# Patient Record
Sex: Male | Born: 1950 | Race: White | Hispanic: No | Marital: Married | Smoking: Never smoker
Health system: Southern US, Community
[De-identification: ages and names within clinical notes are randomized; demographics above are authoritative.]

---

## 2016-03-21 DIAGNOSIS — I10 Essential (primary) hypertension: Secondary | ICD-10-CM | POA: Insufficient documentation

## 2016-03-21 DIAGNOSIS — H8109 Meniere's disease, unspecified ear: Secondary | ICD-10-CM | POA: Insufficient documentation

## 2016-03-21 DIAGNOSIS — E785 Hyperlipidemia, unspecified: Secondary | ICD-10-CM | POA: Insufficient documentation

## 2021-02-08 ENCOUNTER — Encounter: Payer: Self-pay | Admitting: Family Medicine

## 2021-02-08 ENCOUNTER — Other Ambulatory Visit: Payer: Self-pay

## 2021-02-08 ENCOUNTER — Ambulatory Visit (INDEPENDENT_AMBULATORY_CARE_PROVIDER_SITE_OTHER): Payer: Medicare Other | Admitting: Family Medicine

## 2021-02-08 VITALS — BP 130/69 | HR 73 | Ht 69.75 in | Wt 180.4 lb

## 2021-02-08 DIAGNOSIS — R21 Rash and other nonspecific skin eruption: Secondary | ICD-10-CM

## 2021-02-08 DIAGNOSIS — R0609 Other forms of dyspnea: Secondary | ICD-10-CM

## 2021-02-08 DIAGNOSIS — E785 Hyperlipidemia, unspecified: Secondary | ICD-10-CM | POA: Diagnosis not present

## 2021-02-08 DIAGNOSIS — R0600 Dyspnea, unspecified: Secondary | ICD-10-CM | POA: Diagnosis not present

## 2021-02-08 DIAGNOSIS — N529 Male erectile dysfunction, unspecified: Secondary | ICD-10-CM | POA: Diagnosis not present

## 2021-02-08 DIAGNOSIS — I10 Essential (primary) hypertension: Secondary | ICD-10-CM | POA: Diagnosis not present

## 2021-02-08 DIAGNOSIS — Z0000 Encounter for general adult medical examination without abnormal findings: Secondary | ICD-10-CM | POA: Diagnosis not present

## 2021-02-08 DIAGNOSIS — N401 Enlarged prostate with lower urinary tract symptoms: Secondary | ICD-10-CM

## 2021-02-08 DIAGNOSIS — R3911 Hesitancy of micturition: Secondary | ICD-10-CM

## 2021-02-08 NOTE — Progress Notes (Signed)
Office Visit Note   Patient: Bryan Graham           Date of Birth: 27-Jun-1951           MRN: [id number] Visit Date: 02/08/2021 Requested by: No referring provider defined for this encounter. PCP: No primary care provider on file.  Subjective: Chief Complaint  Patient presents with  . Other    Establish primary care    HPI: He is here for a wellness exam.  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  He really enjoys his new job.  He has a history of a brief episode of atrial fibrillation years ago.  He has had occasional heartbeat irregularities lasting only for a few minutes, recently it has been happening probably once per week.  He does not have any chest pain, shortness of breath during these episodes.  He does note that he has had dyspnea on exertion for the past 20 years.  He can no longer run or exercise vigorously because of this.  Again there is no pain in the chest during this, and no musculoskeletal pains.  He has hyperlipidemia and within the past year he started taking a statin.  He has not noticed any side effects.  He has a rash on both of his hands for the past 6 or 7 months.  It is very itchy at times.  He has tried various topical steroids and has seen a dermatologist.  He is getting the most relief from Melaleuca.  No other areas of skin involvement.  He had an episode of Mnire's disease many years ago.  He was placed on hydrochlorothiazide and has been on that since then.  He has had 1 or 2 minor episodes since then but not any in the past 10 years or so.  He gets occasional BPH symptoms with urinary hesitancy.  He has trouble with ED for the past few years.  He is up-to-date on eye exams and dental exams.  He has had colonoscopy.               ROS:   All other systems were reviewed and are negative.  Objective: Vital Signs: BP 130/69   Pulse 73   Ht 5' 9.75" (1.772 m)   Wt 180 lb 6.4 oz (81.8 kg)   BMI 26.07 kg/m   Physical Exam:   General:  Alert and oriented, in no acute distress. Pulm:  Breathing unlabored. Psy:  Normal mood, congruent affect. Skin: He has what looks like eczema on the dorsum of his hands especially near the MCP joints. HEENT:   Tympanic membranes are pearly gray with normal landmarks.  External ear canals are normal.  Nasal passages are clear.  Oropharynx is clear.  No significant lymphadenopathy.  No thyromegaly or nodules.  2+ carotid pulses without bruits. CV: Regular rate and rhythm without murmurs, rubs, or gallops.  No peripheral edema.  2+ radial and posterior tibial pulses. Lungs: Clear to auscultation throughout with no wheezing or areas of consolidation. Abd: Bowel sounds are active, no hepatosplenomegaly or masses.  Soft and nontender.  No audible bruits.  No evidence of ascites. Extremities: 2+ upper and lower DTRs.  No nail deformities.   Imaging: No results found.  Assessment & Plan: 1.   Wellness examination -Labs drawn today.  Follow-up yearly.  2.  Dyspnea on exertion, etiology uncertain. -Cardiology referral for possible stress testing.  3.  Hypertension, well controlled.  4.  Hyperlipidemia -Due to the  timing of starting it, I question whether his statin might be contributing to his hand rash.  I suggested stopping it for a few weeks to see if it makes any difference.  5.  ED and BPH -Check testosterone levels.  Check PSA.  6.  Rash on hands -If stopping statin does not help, then he will try an elimination diet.      Procedures: No procedures performed        PMFS History: Patient Active Problem List   Diagnosis Date Noted  . Benign hypertension 03/21/2016  . Hyperlipidemia 03/21/2016  . Mnire's disease 03/21/2016   History reviewed. No pertinent past medical history.  Family History  Problem Relation Age of Onset  . Alzheimer's disease Mother   . Arthritis Mother   . Arthritis Sister   . Healthy Brother   . Uterine cancer Maternal Grandmother    . Cancer Maternal Grandmother   . Prostate cancer Other   . Cancer Other   . Diabetes Neg Hx   . Colon cancer Neg Hx     History reviewed. No pertinent surgical history. Social History   Occupational History  . Not on file  Tobacco Use  . Smoking status: Not on file  . Smokeless tobacco: Not on file  Substance and Sexual Activity  . Alcohol use: Not on file  . Drug use: Not on file  . Sexual activity: Not on file

## 2021-02-11 ENCOUNTER — Telehealth: Payer: Self-pay | Admitting: Family Medicine

## 2021-02-11 LAB — CBC WITH DIFFERENTIAL/PLATELET
Absolute Monocytes: 821 cells/uL (ref 200–950)
Basophils Absolute: 23 cells/uL (ref 0–200)
Basophils Relative: 0.3 %
Eosinophils Absolute: 152 cells/uL (ref 15–500)
Eosinophils Relative: 2 %
HCT: 46.3 % (ref 38.5–50.0)
Hemoglobin: 16 g/dL (ref 13.2–17.1)
Lymphs Abs: 1140 cells/uL (ref 850–3900)
MCH: 32 pg (ref 27.0–33.0)
MCHC: 34.6 g/dL (ref 32.0–36.0)
MCV: 92.6 fL (ref 80.0–100.0)
MPV: 10.7 fL (ref 7.5–12.5)
Monocytes Relative: 10.8 %
Neutro Abs: 5464 cells/uL (ref 1500–7800)
Neutrophils Relative %: 71.9 %
Platelets: 191 10*3/uL (ref 140–400)
RBC: 5 10*6/uL (ref 4.20–5.80)
RDW: 12 % (ref 11.0–15.0)
Total Lymphocyte: 15 %
WBC: 7.6 10*3/uL (ref 3.8–10.8)

## 2021-02-11 LAB — COMPREHENSIVE METABOLIC PANEL
AG Ratio: 2 (calc) (ref 1.0–2.5)
ALT: 17 U/L (ref 9–46)
AST: 20 U/L (ref 10–35)
Albumin: 4.3 g/dL (ref 3.6–5.1)
Alkaline phosphatase (APISO): 73 U/L (ref 35–144)
BUN: 12 mg/dL (ref 7–25)
CO2: 27 mmol/L (ref 20–32)
Calcium: 9.4 mg/dL (ref 8.6–10.3)
Chloride: 99 mmol/L (ref 98–110)
Creat: 0.77 mg/dL (ref 0.70–1.25)
Globulin: 2.2 g/dL (calc) (ref 1.9–3.7)
Glucose, Bld: 105 mg/dL — ABNORMAL HIGH (ref 65–99)
Potassium: 3.8 mmol/L (ref 3.5–5.3)
Sodium: 136 mmol/L (ref 135–146)
Total Bilirubin: 1.3 mg/dL — ABNORMAL HIGH (ref 0.2–1.2)
Total Protein: 6.5 g/dL (ref 6.1–8.1)

## 2021-02-11 LAB — TESTOSTERONE TOTAL,FREE,BIO, MALES
Albumin: 4.3 g/dL (ref 3.6–5.1)
Sex Hormone Binding: 53 nmol/L (ref 22–77)
Testosterone, Bioavailable: 107.1 ng/dL — ABNORMAL LOW (ref >=110.0)
Testosterone, Free: 54.4 pg/mL (ref 46.0–224.0)
Testosterone: 592 ng/dL (ref 250–827)

## 2021-02-11 LAB — THYROID PANEL WITH TSH
Free Thyroxine Index: 2.4 (ref 1.4–3.8)
T3 Uptake: 31 % (ref 22–35)
T4, Total: 7.8 ug/dL (ref 4.9–10.5)
TSH: 1.77 mIU/L (ref 0.40–4.50)

## 2021-02-11 LAB — LIPID PANEL
Cholesterol: 126 mg/dL (ref <200)
HDL: 54 mg/dL (ref >=40)
LDL Cholesterol (Calc): 56 mg/dL (calc)
Non-HDL Cholesterol (Calc): 72 mg/dL (calc) (ref <130)
Total CHOL/HDL Ratio: 2.3 (calc) (ref <5.0)
Triglycerides: 78 mg/dL (ref <150)

## 2021-02-11 LAB — ANA: Anti Nuclear Antibody (ANA): NEGATIVE

## 2021-02-11 LAB — HIGH SENSITIVITY CRP: hs-CRP: 1.9 mg/L

## 2021-02-11 LAB — PSA: PSA: 0.98 ng/mL (ref <=4.0)

## 2021-02-11 NOTE — Telephone Encounter (Signed)
Thyroid studies look normal.    Testosterone numbers are good.  Glucose is in prediabetes range.  Important to limit intake of processed carbs and sweets, and to maintain regular exercise.  Lipids are getting pretty low.  When total cholesterol drops too low, it can lead to depression and other symptoms.  I think you should consider a lower dose, such as 5 mg every other day.  Recheck lipids in about 6 months.

## 2021-02-22 ENCOUNTER — Ambulatory Visit (INDEPENDENT_AMBULATORY_CARE_PROVIDER_SITE_OTHER): Payer: Medicare Other | Admitting: Cardiovascular Disease

## 2021-02-22 ENCOUNTER — Encounter: Payer: Self-pay | Admitting: Cardiovascular Disease

## 2021-02-22 ENCOUNTER — Other Ambulatory Visit: Payer: Self-pay

## 2021-02-22 DIAGNOSIS — I119 Hypertensive heart disease without heart failure: Secondary | ICD-10-CM

## 2021-02-22 DIAGNOSIS — E782 Mixed hyperlipidemia: Secondary | ICD-10-CM

## 2021-02-22 DIAGNOSIS — I10 Essential (primary) hypertension: Secondary | ICD-10-CM

## 2021-02-22 NOTE — Assessment & Plan Note (Signed)
History of hyperlipidemia with lipid profile performed 02/08/2021 while on Crestor revealing 2000 126, LDL 56 and HDL of 54.  He has since stopped the Crestor to see whether or not a rash on his hand was related to this although this has not changed.  I will leave it up to Dr. Prince Rome, his PCP, to determine whether to reinitiate statin therapy and at what dose.

## 2021-02-22 NOTE — Patient Instructions (Signed)
Medication Instructions:  Your physician recommends that you continue on your current medications as directed. Please refer to the Current Medication list given to you today.  *If you need a refill on your cardiac medications before your next appointment, please call your pharmacy*   Testing/Procedures: Your physician has requested that you have an echocardiogram. Echocardiography is a painless test that uses sound waves to create images of your heart. It provides your doctor with information about the size and shape of your heart and how well your heart's chambers and valves are working. This procedure takes approximately one hour. There are no restrictions for this procedure. This test is done at [address]  Dr. Allyson Sabal has ordered a CT coronary calcium score. This test is done at [address]. This is $99 out of pocket.   Coronary CalciumScan A coronary calcium scan is an imaging test used to look for deposits of calcium and other fatty materials (plaques) in the inner lining of the blood vessels of the heart (coronary arteries). These deposits of calcium and plaques can partly clog and narrow the coronary arteries without producing any symptoms or warning signs. This puts a person at risk for a heart attack. This test can detect these deposits before symptoms develop. Tell a health care provider about:  Any allergies you have.  All medicines you are taking, including vitamins, herbs, eye drops, creams, and over-the-counter medicines.  Any problems you or family members have had with anesthetic medicines.  Any blood disorders you have.  Any surgeries you have had.  Any medical conditions you have.  Whether you are pregnant or may be pregnant. What are the risks? Generally, this is a safe procedure. However, problems may occur, including:  Harm to a pregnant woman and her unborn baby. This test involves the use of radiation. Radiation exposure  can be dangerous to a pregnant woman and her unborn baby. If you are pregnant, you generally should not have this procedure done.  Slight increase in the risk of cancer. This is because of the radiation involved in the test. What happens before the procedure? No preparation is needed for this procedure. What happens during the procedure?  You will undress and remove any jewelry around your neck or chest.  You will put on a hospital gown.  Sticky electrodes will be placed on your chest. The electrodes will be connected to an electrocardiogram (ECG) machine to record a tracing of the electrical activity of your heart.  A CT scanner will take pictures of your heart. During this time, you will be asked to lie still and hold your breath for 2-3 seconds while a picture of your heart is being taken. The procedure may vary among health care providers and hospitals. What happens after the procedure?  You can get dressed.  You can return to your normal activities.  It is up to you to get the results of your test. Ask your health care provider, or the department that is doing the test, when your results will be ready. Summary  A coronary calcium scan is an imaging test used to look for deposits of calcium and other fatty materials (plaques) in the inner lining of the blood vessels of the heart (coronary arteries).  Generally, this is a safe procedure. Tell your health care provider if you are pregnant or may be pregnant.  No preparation is needed for this procedure.  A CT scanner will take pictures of your  heart.  You can return to your normal activities after the scan is done. This information is not intended to replace advice given to you by your health care provider. Make sure you discuss any questions you have with your health care provider. Document Released: 06/12/2008 Document Revised: 11/03/2016 Document Reviewed: 11/03/2016 Elsevier Interactive Patient Education  2017 [employer].    Follow-Up: At [facility], you and your health needs are our priority.  As part of our continuing mission to provide you with exceptional heart care, we have created designated Provider Care Teams.  These Care Teams include your primary Cardiologist (physician) and Advanced Practice Providers (APPs -  Physician Assistants and Nurse Practitioners) who all work together to provide you with the care you need, when you need it.  We recommend signing up for the patient portal called "MyChart".  Sign up information is provided on this After Visit Summary.  MyChart is used to connect with patients for Virtual Visits (Telemedicine).  Patients are able to view lab/test results, encounter notes, upcoming appointments, etc.  Non-urgent messages can be sent to your provider as well.   To learn more about what you can do with MyChart, go to [email or web address].    Your next appointment:   No future appointments have been made. If you need an appointment please give our office a call.   Provider:   Nanetta Batty, MD

## 2021-02-22 NOTE — Assessment & Plan Note (Signed)
Of essential potential blood pressure measured today 128/72.  He is on Maxide which she takes in addition for his Mnire's disease.

## 2021-02-22 NOTE — Progress Notes (Signed)
02/22/2021 Bryan Graham   May 10, 1951  [id number]  Primary Physician Pcp, No Primary Cardiologist: Bryan Gess MD Bryan Graham  HPI:  Bryan Graham is a 70 y.o. thin and fit appearing married Caucasian male with no children referred by Dr. Prince Graham, his PCP, for cardiovascular valuation because of mild dyspnea and fatigue.  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  His risk factors include treated hyperlipidemia and essential hypertension.  Is no family history for heart disease.  He denies chest pain or shortness of breath.  He is fairly active although less so than he was in his younger years which is bothersome to him.  He also complains of some potency issues.   Current Meds  Medication Sig  . B Complex Vitamins (VITAMIN-B COMPLEX PO) Take by mouth in the morning and at bedtime.  . Chelated Zinc 23 MG LOZG   . Cholecalciferol (VITAMIN D3) 50 MCG (2000 UT) TABS Take by mouth 2 (two) times daily.  . NON FORMULARY daily.  . Omega-3 Fatty Acids (FISH OIL PO) Take by mouth 2 (two) times daily.  Marland Kitchen QUERCETIN PO Take 500 mg by mouth in the morning and at bedtime.  . triamterene-hydrochlorothiazide (MAXZIDE-25) 37.5-25 MG tablet Take 1 tablet by mouth daily.  . vitamin C (ASCORBIC ACID) 500 MG tablet Take 500 mg by mouth 2 (two) times daily. Unsure of mg  . [DISCONTINUED] Quercetin 250 MG TABS Take 500 mg by mouth 2 (two) times daily.  . [DISCONTINUED] Zinc 50 MG TABS Take by mouth in the morning and at bedtime.     No Known Allergies  Social History   Socioeconomic History  . Marital status: Married    Spouse name: Not on file  . Number of children: Not on file  . Years of education: Not on file  . Highest education level: Not on file  Occupational History  . Not on file  Tobacco Use  . Smoking status: Not on file  . Smokeless  tobacco: Not on file  Substance and Sexual Activity  . Alcohol use: Not on file  . Drug use: Not on file  . Sexual activity: Not on file  Other Topics Concern  . Not on file  Social History Narrative  . Not on file   Social Determinants of Health   Financial Resource Strain: Not on file  Food Insecurity: Not on file  Transportation Needs: Not on file  Physical Activity: Not on file  Stress: Not on file  Social Connections: Not on file  Intimate Partner Violence: Not on file     Review of Systems: General: negative for chills, fever, night sweats or weight changes.  Cardiovascular: negative for chest pain, dyspnea on exertion, edema, orthopnea, palpitations, paroxysmal nocturnal dyspnea or shortness of breath Dermatological: negative for rash Respiratory: negative for cough or wheezing Urologic: negative for hematuria Abdominal: negative for nausea, vomiting, diarrhea, bright red blood per rectum, melena, or hematemesis Neurologic: negative for visual changes, syncope, or dizziness All other systems reviewed and are otherwise negative except as noted above.    Blood pressure 128/72, pulse 65, height 5\' 11"  (1.803 m), weight 188 lb 6.4 oz (85.5 kg), SpO2 98 %.  General appearance: alert and no distress Neck: no adenopathy, no carotid bruit, no JVD, supple, symmetrical, trachea midline and thyroid not enlarged, symmetric, no tenderness/mass/nodules  Lungs: clear to auscultation bilaterally Heart: regular rate and rhythm, S1, S2 normal, no murmur, click, rub or gallop Extremities: extremities normal, atraumatic, no cyanosis or edema Pulses: 2+ and symmetric Skin: Skin color, texture, turgor normal. No rashes or lesions Neurologic: Alert and oriented X 3, normal strength and tone. Normal symmetric reflexes. Normal coordination and gait  EKG sinus rhythm at 65 with RSR prime in lead V1 consistent with an RV conduction delay and/or incomplete right bundle branch block.  I  personally reviewed this EKG.  ASSESSMENT AND PLAN:   Hyperlipidemia History of hyperlipidemia with lipid profile performed 02/08/2021 while on Crestor revealing 2000 126, LDL 56 and HDL of 54.  He has since stopped the Crestor to see whether or not a rash on his hand was related to this although this has not changed.  I will leave it up to Dr. Prince Graham, his PCP, to determine whether to reinitiate statin therapy and at what dose.  Benign hypertension Of essential potential blood pressure measured today 128/72.  He is on Maxide which she takes in addition for his Mnire's disease.      Bryan Gess MD FACP,FACC,FAHA, [facility] 02/22/2021 9:47 AM

## 2021-03-16 ENCOUNTER — Encounter: Payer: Self-pay | Admitting: Family Medicine

## 2021-03-18 MED ORDER — TRIAMTERENE-HCTZ 37.5-25 MG PO TABS: 1.0000 | ORAL_TABLET | Freq: Every day | ORAL | 3 refills | Status: DC

## 2021-03-21 ENCOUNTER — Ambulatory Visit (HOSPITAL_COMMUNITY): Payer: Medicare Other | Attending: Cardiovascular Disease

## 2021-03-21 ENCOUNTER — Ambulatory Visit (INDEPENDENT_AMBULATORY_CARE_PROVIDER_SITE_OTHER)
Admission: RE | Admit: 2021-03-21 | Discharge: 2021-03-21 | Disposition: A | Discharge status: Home / Self Care | Payer: Self-pay | Source: Ambulatory Visit | Attending: Cardiovascular Disease | Admitting: Cardiovascular Disease

## 2021-03-21 ENCOUNTER — Other Ambulatory Visit: Payer: Self-pay

## 2021-03-21 DIAGNOSIS — E782 Mixed hyperlipidemia: Secondary | ICD-10-CM

## 2021-03-21 DIAGNOSIS — I10 Essential (primary) hypertension: Secondary | ICD-10-CM

## 2021-03-21 DIAGNOSIS — I119 Hypertensive heart disease without heart failure: Secondary | ICD-10-CM

## 2021-03-21 LAB — ECHOCARDIOGRAM COMPLETE
Area-P 1/2: 2.84 cm2
S' Lateral: 2 cm

## 2021-05-04 ENCOUNTER — Encounter: Payer: Self-pay | Admitting: Family Medicine

## 2021-05-06 MED ORDER — TRIAMTERENE-HCTZ 37.5-25 MG PO TABS: 1.0000 | ORAL_TABLET | Freq: Every day | ORAL | 3 refills | Status: DC

## 2021-05-14 ENCOUNTER — Encounter: Payer: Self-pay | Admitting: Family Medicine

## 2021-05-14 DIAGNOSIS — G479 Sleep disorder, unspecified: Secondary | ICD-10-CM

## 2021-05-14 DIAGNOSIS — R5383 Other fatigue: Secondary | ICD-10-CM

## 2021-06-27 ENCOUNTER — Encounter: Payer: Self-pay | Admitting: Family Medicine

## 2021-06-27 DIAGNOSIS — R21 Rash and other nonspecific skin eruption: Secondary | ICD-10-CM

## 2021-06-27 DIAGNOSIS — M79642 Pain in left hand: Secondary | ICD-10-CM

## 2021-06-27 DIAGNOSIS — M79641 Pain in right hand: Secondary | ICD-10-CM

## 2021-07-09 ENCOUNTER — Telehealth: Payer: Self-pay

## 2021-07-09 ENCOUNTER — Encounter: Payer: Self-pay | Admitting: Family Medicine

## 2021-07-09 DIAGNOSIS — R21 Rash and other nonspecific skin eruption: Secondary | ICD-10-CM

## 2021-07-09 NOTE — Telephone Encounter (Signed)
Patient called he stated he received a mychart message that told him to make a appointment with Dr.Hilts patient stated he is supposed to be referred to another provider he isn't sure why he needs to see Dr.Hilts again he is requesting to be contacted through mychart or by cell:[phone]

## 2021-07-09 NOTE — Telephone Encounter (Signed)
Does the patient need to come back in here? I cannot see anything other than the referrals to specialists.

## 2021-07-09 NOTE — Telephone Encounter (Signed)
Sent the patient a MyChart message - no need to come back in to see Dr. Prince Rome at this point.

## 2021-07-23 ENCOUNTER — Institutional Professional Consult (permissible substitution): Payer: Medicare Other | Admitting: Neurology

## 2021-08-05 MED ORDER — TRIAMTERENE-HCTZ 37.5-25 MG PO TABS: 1.0000 | ORAL_TABLET | Freq: Every day | ORAL | 3 refills | Status: AC

## 2021-08-05 NOTE — Addendum Note (Signed)
Addended by: Lillia Carmel on: 08/05/2021 08:10 AM   Modules accepted: Orders

## 2021-08-13 ENCOUNTER — Encounter: Payer: Self-pay | Admitting: Family Medicine

## 2021-09-03 ENCOUNTER — Encounter: Payer: Self-pay | Admitting: Neurology

## 2021-09-03 ENCOUNTER — Ambulatory Visit (INDEPENDENT_AMBULATORY_CARE_PROVIDER_SITE_OTHER): Payer: Medicare Other | Admitting: Neurology

## 2021-09-03 VITALS — BP 129/76 | HR 66 | Ht 71.0 in | Wt 177.0 lb

## 2021-09-03 DIAGNOSIS — G47429 Narcolepsy in conditions classified elsewhere without cataplexy: Secondary | ICD-10-CM | POA: Diagnosis not present

## 2021-09-03 DIAGNOSIS — G4710 Hypersomnia, unspecified: Secondary | ICD-10-CM | POA: Diagnosis not present

## 2021-09-03 NOTE — Patient Instructions (Signed)
Narcolepsy Narcolepsy is a neurological disorder that causes people to fall asleep suddenly and without control (have sleep attacks) during the daytime. It is a lifelong disorder. Narcolepsy disrupts the sleep cycle at night, which then causes daytime sleepiness. What are the causes? The cause of narcolepsy is not fully understood, but it may be related to: Low levels of hypocretin, a chemical (neurotransmitter) in the brain that controls sleep and wake cycles. Hypocretin imbalance may be caused by: Abnormal genes that are passed from parent to child (inherited). An autoimmune disease in which the body's defense system (immune system) attacks the brain cells that make hypocretin. Infection, tumor, or injury in the area of the brain that controls sleep. Exposure to poisons (toxins), such as heavy metals, pesticides, and secondhand smoke. What are the signs or symptoms? Symptoms of this condition include: Excessive daytime sleepiness. This is the most common symptom and is usually the first symptom you will notice. This may affect your performance at work or school. Sleep attacks. You may fall asleep in the middle of an activity, especially low-energy activities like reading or watching TV. Feeling like you cannot think clearly and trouble focusing or remembering things. You may also feel depressed. Sudden muscle weakness (cataplexy). When this occurs, your speech may become slurred, or your knees may buckle. Cataplexy is usually triggered by surprise, anger, fear, or laughter. Losing the ability to speak or move (sleep paralysis). This may occur just as you start to fall asleep or wake up. You will be aware of the paralysis. It usually lasts for just a few seconds or minutes. Seeing, hearing, tasting, smelling, or feeling things that are not real (hallucinations). Hallucinations may occur with sleep paralysis. They can happen when you are falling asleep, waking up, or dozing. Trouble staying asleep  at night (insomnia) and restless sleep. How is this diagnosed? This condition may be diagnosed based on: A physical exam to rule out any other problems that may be causing your symptoms. You may be asked to write down your sleeping patterns for several weeks in a sleep diary. This will help your health care provider make a diagnosis. Sleep studies that measure how well your REM sleep is regulated. These tests also measure your heart rate, breathing, movement, and brain waves. These tests include: An overnight sleep study (polysomnogram). A daytime sleep study that is done while you take several naps during the day (multiple sleep latency test, MSLT). This test measures how quickly you fall asleep and how quickly you enter REM sleep. Removal of spinal fluid to measure hypocretin levels. How is this treated? There is no cure for this condition, but treatment can help relieve symptoms. Treatment may include: Lifestyle and sleeping strategies to help you cope with the condition, such as: Exercising regularly. Maintaining a regular sleep schedule. Avoiding caffeine and large meals before bed. Medicines. These may include: Medicines that help keep you awake and alert (stimulants) to fight daytime sleepiness. Medicines that treat depression (antidepressants). These may be used to treat cataplexy. Sodium oxybate. This is a strong medicine to help you relax (sedative) that you may take at night. It can help control daytime sleepiness and cataplexy. Other treatments may include mental health counseling or joining a support group. Follow these instructions at home: Sleeping habits  Get about 8 hours of sleep every night. Go to sleep and get up at about the same time every day. Keep your bedroom dark, quiet, and comfortable. When you feel very tired, take short naps. Schedule naps   so that you take them at about the same time every day. Before bedtime: Avoid bright lights and screens. Relax. Try  activities like reading or taking a warm bath. Activity Get at least 20 minutes of exercise every day. This will help you sleep better at night and reduce daytime sleepiness. Avoid exercising within 3 hours of bedtime. Do not drive or use heavy machinery if you are sleepy. If possible, take a nap before driving. Do not swim or go out on the water without a life jacket. Eating and drinking Do not drink alcohol or caffeinated beverages within 4-5 hours of bedtime. Do not eat a large meal before bedtime. Eat meals at about the same times every day. General instructions  Take over-the-counter and prescription medicines only as told by your health care provider. Keep a sleep diary as told by your health care provider. Tell your employer or teachers that you have narcolepsy. You may be able to adjust your schedule to include time for naps. Do not use any products that contain nicotine or tobacco, such as cigarettes, e-cigarettes, and chewing tobacco. If you need help quitting, ask your health care provider. Keep all follow-up visits as told by your health care provider. This is important. Where to find more information [employer] of Neurological Disorders: [employer] Contact a health care provider if: Your symptoms are not getting better. You have increasingly high blood pressure (hypertension). You have changes in your heart rhythm. You are having a hard time determining what is real and what is not (psychosis). Get help right away if you: Hurt yourself during a sleep attack or an attack of cataplexy. Have chest pain. Have trouble breathing. These symptoms may represent a serious problem that is an emergency. Do not wait to see if the symptoms will go away. Get medical help right away. Call your local emergency services (911 in the [place] Do not drive yourself to the hospital. Summary Narcolepsy is a neurological disorder that causes people to fall asleep suddenly, and without  control, during the daytime (sleep attacks). It is a lifelong disorder. There is no cure for this condition, but treatment can help relieve symptoms. Go to sleep and get up at about the same time every day. Follow instructions about sleep and activities as told by your health care provider. Take over-the-counter and prescription medicines only as told by your health care provider. This information is not intended to replace advice given to you by your health care provider. Make sure you discuss any questions you have with your health care provider. Document Revised: 07/27/2019 Document Reviewed: 07/27/2019 Elsevier Patient Education  2022 [employer] Inc. Hypersomnia Hypersomnia is a condition in which a person feels very tired during the day even though he or she gets plenty of sleep at night. A person with this condition may take naps during the day and may find it very difficult to wake up from sleep. Hypersomnia may affect a person's ability to think, concentrate, drive, or remember things. What are the causes? The cause of this condition may not be known. Possible causes include: Certain medicines. Sleep disorders, such as narcolepsy and sleep apnea. Injury to the head, brain, or spinal cord. Drug or alcohol use. Gastroesophageal reflux disease (GERD). Tumors. Certain medical conditions, such as depression, diabetes, or an underactive thyroid gland (hypothyroidism). What are the signs or symptoms? The main symptoms of hypersomnia include: Feeling very tired throughout the day, regardless of how much sleep you got the night before. Having trouble waking up.  Others may find it difficult to wake you up when you are sleeping. Sleeping for longer and longer periods at a time. Taking naps throughout the day. Other symptoms may include: Feeling restless, anxious, or annoyed. Lacking energy. Having trouble with: Remembering. Speaking. Thinking. Loss of appetite. Seeing, hearing, tasting,  smelling, or feeling things that are not real (hallucinations). How is this diagnosed? This condition may be diagnosed based on: Your symptoms and medical history. Your sleeping habits. Your health care provider may ask you to write down your sleeping habits in a daily sleep log, along with any symptoms you have. A series of tests that are done while you sleep (sleep study or polysomnogram). A test that measures how quickly you can fall asleep during the day (daytime nap study or multiple sleep latency test). How is this treated? Treatment can help you manage your condition. Treatment may include: Following a regular sleep routine. Lifestyle changes, such as changing your eating habits, getting regular exercise, and avoiding alcohol or caffeinated beverages. Taking medicines to make you more alert (stimulants) during the day. Treating any underlying medical causes of hypersomnia. Follow these instructions at home: Sleep routine  Schedule the same bedtime and wake-up time each day. Practice a relaxing bedtime routine. This may include reading, meditation, deep breathing, or taking a warm bath before going to sleep. Get regular exercise each day. Avoid strenuous exercise in the evening hours. Keep your sleep environment at a cooler temperature, darkened, and quiet. Sleep with pillows and a mattress that are comfortable and supportive. Schedule short 20-minute naps for when you feel sleepiest during the day. Talk with your employer or teachers about your hypersomnia. If possible, adjust your schedule so that: You have a regular daytime work schedule. You can take a scheduled nap during the day. You do not have to work or be active at night. Do not eat a heavy meal for a few hours before bedtime. Eat your meals at about the same times every day. Avoid drinking alcohol or caffeinated beverages. Safety  Do not drive or use heavy machinery if you are sleepy. Ask your health care provider if it  is safe for you to drive. Wear a life jacket when swimming or spending time near water. General instructions Take supplements and over-the-counter and prescription medicines only as told by your health care provider. Keep a sleep log that will help your doctor manage your condition. This may include information about: What time you go to bed each night. How often you wake up at night. How many hours you sleep at night. How often and for how long you nap during the day. Any observations from others, such as leg movements during sleep, sleep walking, or snoring. Keep all follow-up visits as told by your health care provider. This is important. Contact a health care provider if: You have new symptoms. Your symptoms get worse. Get help right away if: You have serious thoughts about hurting yourself or someone else. If you ever feel like you may hurt yourself or others, or have thoughts about taking your own life, get help right away. You can go to your nearest emergency department or call: Your local emergency services (911 in the [place] A suicide crisis helpline, such as the National Suicide Prevention Lifeline at 775 709 3466. This is open 24 hours a day. Summary Hypersomnia refers to a condition in which you feel very tired during the day even though you get plenty of sleep at night. A person with this condition may  take naps during the day and may find it very difficult to wake up from sleep. Hypersomnia may affect a person's ability to think, concentrate, drive, or remember things. Treatment, such as following a regular sleep routine and making some lifestyle changes, can help you manage your condition. This information is not intended to replace advice given to you by your health care provider. Make sure you discuss any questions you have with your health care provider. Document Revised: 10/25/2020 Document Reviewed: 10/25/2020 Elsevier Patient Education  2022 [employer].

## 2021-09-03 NOTE — Progress Notes (Addendum)
SLEEP MEDICINE CLINIC    Provider:  Larey Seat, MD  Primary Care Physician:  Eunice Blase, MD No address on file     Referring Provider: Dr Gwenlyn Found, MD Cardiology.       Chief Complaint according to patient   Patient presents with:     New Patient (Initial Visit)     I still can fall asleep,but I am mainly fatigued, getting 6 hours of sleep but feel not refreshed.  Covid 19 in July- August 22.      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 Still having lots of dream sleep.  Treated with low dose ritalin for decades and scheduled naps.   Now I am SOB and fatigued, and I can't get exercise. Tested for cardiac factors, testosterone, and all normal. He feels exhausted , had covid recently, making fatigue worse.    I have the pleasure of seeing Bryan Graham today, a right -handed Caucasian male and  has no past medical history on file. NARCOLEPSY, viral labyrinthitis , Tinnitus, hearing loss. MRI negative. Meniere's disease..       Sleep relevant medical history:   narcolepsy- feeling cold at night.   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 The patient currently works in daytime. Pets are not present. Tobacco use: none .  ETOH use - seldomly or none, Caffeine intake in form of Coffee( one cup a  day) Soda( /) Tea ( sometimes , hot) or energy drinks. Regular exercise in form of walking. .   Hobbies :      Sleep habits are as follows: The  patient's dinner time is between 5-7 PM. The patient goes to bed at 11-12 PM and continues to sleep for 6 hours, wakes rarely for any bathroom breaks. He wakes up hourly and doesn't know why.     The preferred sleep position is variable, propping up his elbows. with the support of 1-2 pillows.  Dreams are reportedly frequent/vivid. Snoring only in supine.   7.30 AM is the usual rise time. Leaves home at 8.15 .The patient wakes up with an alarm.  He reports not for long feeling refreshed or restored in AM, with symptoms such as dry mouth, and residual fatigue. Naps are taken frequently, lasting from 15 to 30 minutes and are more refreshing than nocturnal sleep.    Review of Systems: Out of a complete 14 system review, the patient complains of only the following symptoms, and all other reviewed systems are negative.:  Fatigue, sleepiness , snoring, fragmented sleep, vivid dreams. Naps daily.    How likely are you to doze in the following situations: 0 = not likely, 1 = slight chance, 2 = moderate chance, 3 = high chance   Sitting and Reading? Watching Television? Sitting inactive in a public place (theater or meeting)? As a passenger in a car for an hour without a  break? Lying down in the afternoon when circumstances permit? Sitting and talking to someone? Sitting quietly after lunch without alcohol? In a car, while stopped for a few minutes in traffic?   Total = 12/ 24 points   FSS endorsed at 39/ 63 points.   Social History   Socioeconomic History   Marital status: Married    Spouse name: Not on file   Number of children: Not on file   Years of education: Not on file   Highest education level: Not on file  Occupational History   Not on file  Tobacco Use   Smoking status: Never   Smokeless tobacco: Never  Substance and Sexual Activity   Alcohol use: Not on file   Drug use: Never   Sexual activity: Not on file  Other Topics Concern   Not on file  Social History Narrative    Not on file   Social Determinants of Health   Financial Resource Strain: Not on file  Food Insecurity: Not on file  Transportation Needs: Not on file  Physical Activity: Not on file  Stress: Not on file  Social Connections: Not on file    Family History  Problem Relation Age of Onset   Alzheimer's disease Mother    Arthritis Mother    Arthritis Sister    Healthy Brother    Uterine cancer Maternal Grandmother    Cancer Maternal Grandmother    Prostate cancer Other    Cancer Other    Diabetes Neg Hx    Colon cancer Neg Hx     Current Outpatient Medications on File Prior to Visit  Medication Sig Dispense Refill   B Complex Vitamins (VITAMIN-B COMPLEX PO) Take by mouth in the morning and at bedtime.     Chelated Zinc 23 MG LOZG      Cholecalciferol (VITAMIN D3) 50 MCG (2000 UT) TABS Take by mouth 2 (two) times daily.     NON FORMULARY daily.     Omega-3 Fatty Acids (FISH OIL PO) Take by mouth 2 (two) times daily.     QUERCETIN PO Take 500 mg by mouth in the morning and at bedtime.     triamterene-hydrochlorothiazide (MAXZIDE-25) 37.5-25 MG tablet Take 1 tablet by mouth daily. 90 tablet 3   vitamin C (ASCORBIC ACID) 500 MG tablet Take 500 mg by mouth 2 (two) times daily. Unsure of mg     No current facility-administered medications on file prior to visit.    No Known Allergies  Physical exam:  Today's Vitals   09/03/21 0853  BP: 129/76  Pulse: 66  Weight: 177 lb (80.3 kg)  Height: 5' 11"  (1.803 m)   Body mass index is 24.69 kg/m.   Wt Readings from Last 3 Encounters:  09/03/21 177 lb (80.3 kg)  02/22/21 188 lb 6.4 oz (85.5 kg)  02/08/21 180 lb 6.4 oz (81.8 kg)     Ht Readings from Last 3 Encounters:  09/03/21 5' 11"  (1.803 m)  02/22/21 5' 11"  (1.803 m)  02/08/21 5' 9.75" (1.772 m)      General: The patient is awake, alert and appears not in acute distress. The patient is well groomed. Head: Normocephalic, atraumatic. Neck is supple. Mallampati ,1 -  elongated uvula neck circumference: 18 inches . Nasal airflow patent.   Retrognathia is seen.  Dental status:  Cardiovascular:  Regular rate and cardiac rhythm by pulse,  without distended neck veins. Respiratory: Lungs are clear to auscultation.  Skin:  Without evidence of ankle  edema, or rash. Trunk: The patient's posture is erect.   Neurologic exam : The patient is awake and alert, oriented to place and time.   Memory subjective described as intact.  Attention span & concentration ability appears normal.  Speech is fluent,  without  dysarthria, dysphonia or aphasia.  Mood and affect are appropriate.   Cranial nerves: no loss of smell or taste reported  Pupils are equal and briskly reactive to light. Funduscopic exam deferred. .  Extraocular movements in vertical and horizontal planes were intact and without nystagmus. No Diplopia. Visual fields by finger perimetry are intact. Hearing was intact to tuning fork, soft voice but not finger rubbing.  Facial sensation intact to fine touch. Facial motor strength is symmetric and tongue and uvula move midline.  Neck ROM : rotation, tilt and flexion extension were normal for age and shoulder shrug was symmetrical.    Motor exam:  Symmetric bulk, tone and ROM.   Normal tone without cog- wheeling, symmetric grip strength .   Sensory:  Fine touch, pinprick and vibration were tested  and  normal.  Proprioception tested in the upper extremities was normal.   Coordination: Rapid alternating movements in the fingers/hands were of normal speed.  The Finger-to-nose maneuver was intact without evidence of ataxia, dysmetria or tremor.   Gait and station: Patient could rise unassisted from a seated position, walked without assistive device.  Stance is of normal width/ base and the patient turned with 3 steps.  Toe and heel walk were deferred.  Deep tendon reflexes: in the  upper and lower extremities are symmetric and intact.  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 He has fatigue more than excessive daytime sleepiness.  Normal cardiac evaluation, incomplete RBBB was mentioned, normal calcium score.   2) He no longer uses ritalin or stimulant products.  3) he manages with scheduled naps.    My Plan is to proceed with:  1) PSG and MSLT to follow.   2) HLA narcolepsy test for type one.   3) no cataplexy. No sleep paralysis,   I would like to thank Quay Burow, MD and Hilts, Legrand Como, Md No address on file for allowing me to meet with and to take care of this pleasant patient.   In short, Bryan Graham , MD ,is presenting with excessive daytime sleepiness.  I plan to follow up either personally or through our NP within 2-4  month.     Electronically signed by: Larey Seat, MD 09/03/2021 9:18 AM  [facility] Board certified by The [employer] of Sleep Medicine and Diplomate of the [employer] of Sleep Medicine. Board certified In Neurology through the [place], Fellow of the [employer] of Neurology. Medical Director  of [facility].

## 2021-09-09 LAB — NARCOLEPSY EVALUATION
DQA1*01:02: POSITIVE
DQB1*06:02: POSITIVE

## 2021-09-09 NOTE — Progress Notes (Signed)
HLA narcolepsy panel is positive .

## 2021-09-10 ENCOUNTER — Telehealth: Payer: Self-pay | Admitting: *Deleted

## 2021-09-10 NOTE — Telephone Encounter (Signed)
-----  Message from Larey Seat, MD sent at 09/09/2021  4:39 PM EDT ----- HLA narcolepsy panel is positive .

## 2021-09-10 NOTE — Telephone Encounter (Signed)
Noted that pt did see the result for HLA 09-10-21.

## 2021-09-24 ENCOUNTER — Telehealth: Payer: Self-pay

## 2021-09-24 NOTE — Telephone Encounter (Signed)
LVM for pt to call me back to schedule sleep study  

## 2021-10-01 ENCOUNTER — Telehealth: Payer: Self-pay

## 2021-10-01 NOTE — Telephone Encounter (Signed)
LVM for pt to call me back to schedule sleep study  

## 2021-10-07 ENCOUNTER — Telehealth: Payer: Self-pay

## 2021-10-07 NOTE — Telephone Encounter (Signed)
We have attempted to call the patient 2 times to schedule sleep study. Patient has been unavailable at the phone numbers we have on file and has not returned our calls. If patient calls back we will schedule them for their sleep study. ° °

## 2022-01-09 DIAGNOSIS — M9901 Segmental and somatic dysfunction of cervical region: Secondary | ICD-10-CM | POA: Diagnosis not present

## 2022-01-09 DIAGNOSIS — M9902 Segmental and somatic dysfunction of thoracic region: Secondary | ICD-10-CM | POA: Diagnosis not present

## 2022-01-09 DIAGNOSIS — M9903 Segmental and somatic dysfunction of lumbar region: Secondary | ICD-10-CM | POA: Diagnosis not present

## 2022-03-18 DIAGNOSIS — Z0000 Encounter for general adult medical examination without abnormal findings: Secondary | ICD-10-CM | POA: Diagnosis not present

## 2022-03-18 DIAGNOSIS — E785 Hyperlipidemia, unspecified: Secondary | ICD-10-CM | POA: Diagnosis not present

## 2022-08-26 DIAGNOSIS — L2389 Allergic contact dermatitis due to other agents: Secondary | ICD-10-CM | POA: Diagnosis not present

## 2023-01-09 IMAGING — CT CT CARDIAC CORONARY ARTERY CALCIUM SCORE
3 series · 14 of 20 positions shown, 15 images · non-contrast
Comparison: None.
COMPARISON: None.

Addendum:
EXAM:
OVER-READ INTERPRETATION  CT CHEST

The following report is an over-read performed by radiologist Dr.
Sorin Oxendine [REDACTED] on 03/21/2021. This
over-read does not include interpretation of cardiac or coronary
anatomy or pathology. The coronary calcium score interpretation by
the cardiologist is attached.
CLINICAL DATA: Cardiovascular Disease Risk stratification
Coronary Calcium Score
TECHNIQUE: A gated, non-contrast computed tomography scan of the heart was
performed using 3mm slice thickness. Axial images were analyzed on a
dedicated workstation. Calcium scoring of the coronary arteries was
performed using the Agatston method.

[Series 2: casc 3.0 bv41 2 bestdiast 68 % · axial · 0.40mm/px · z∈[-253,-175]mm · 4 of 44 slices shown, 5 images]
[im 9/44  vessel]
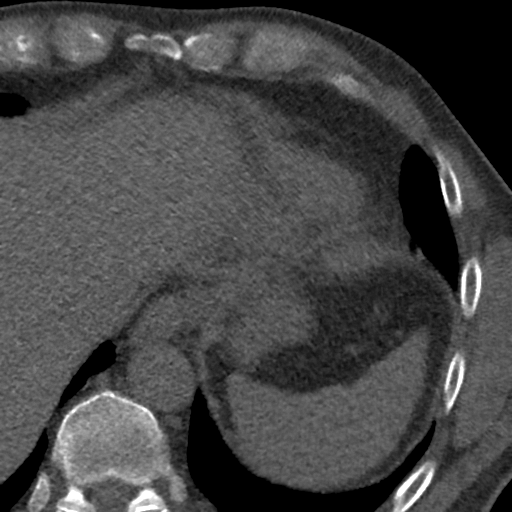
[im 9/44  lung]
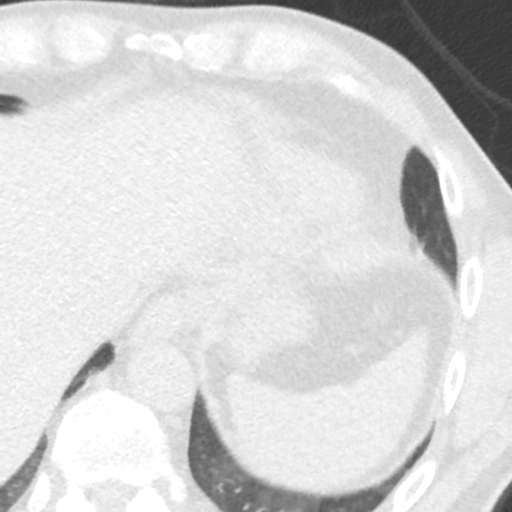
[im 18/44  vessel]
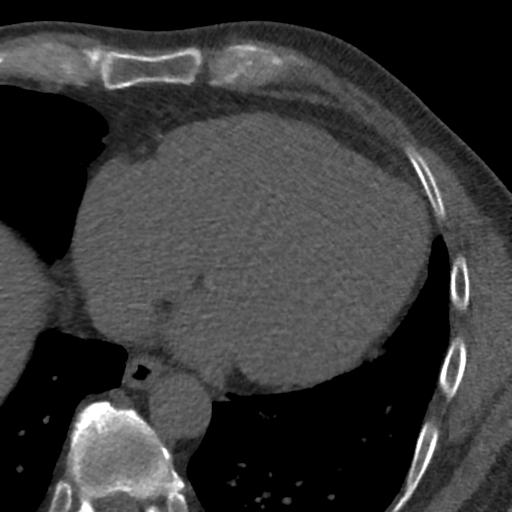
[im 26/44  vessel]
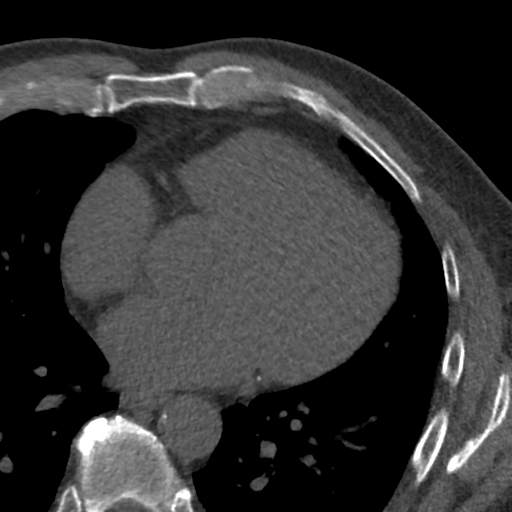
[im 35/44  vessel]
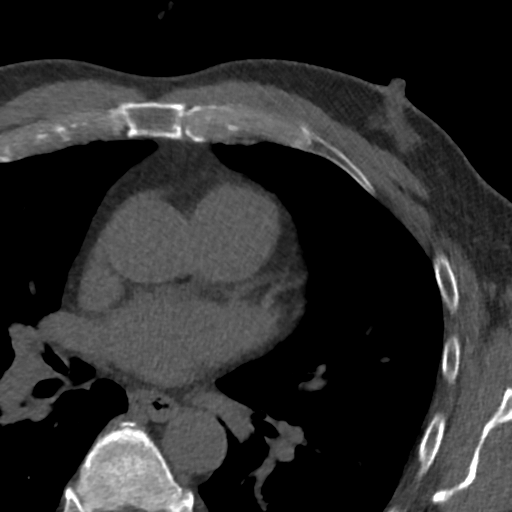

[Series 3: lung 72 % · axial · 0.73mm/px · z∈[-256,-172]mm · 5 of 44 slices shown]
[im 8/44  lung]
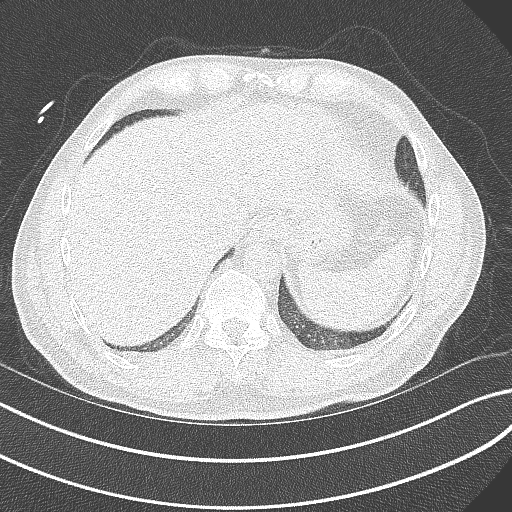
[im 15/44  lung]
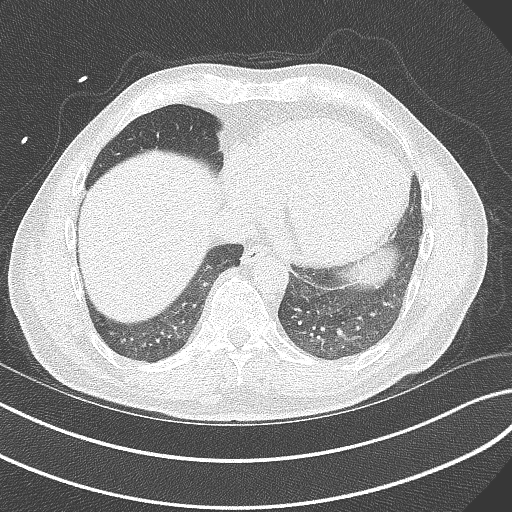
[im 22/44  lung]
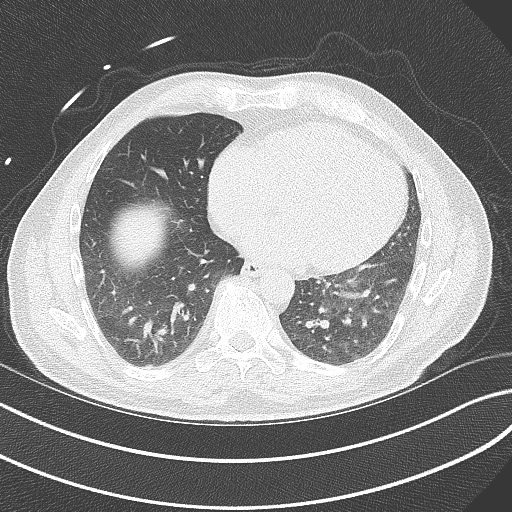
[im 29/44  lung]
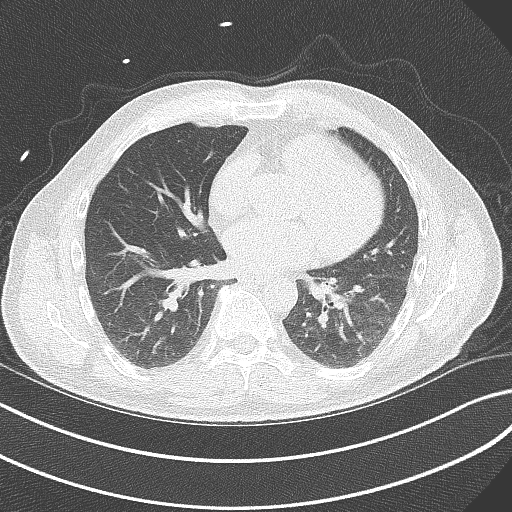
[im 36/44  lung]
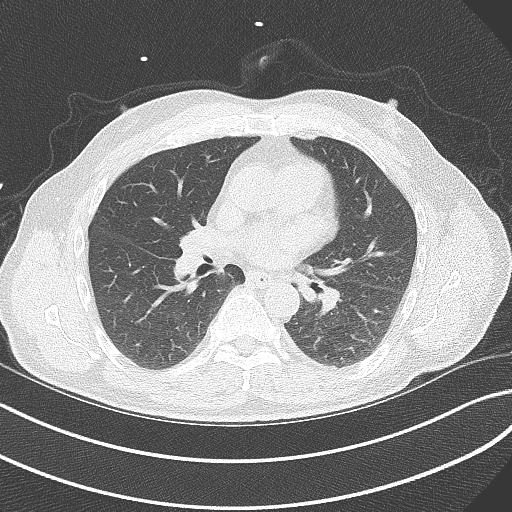

[Series 4: lung st 72 % · axial · 0.73mm/px · z∈[-256,-172]mm · 5 of 44 slices shown]
[im 8/44  lung]
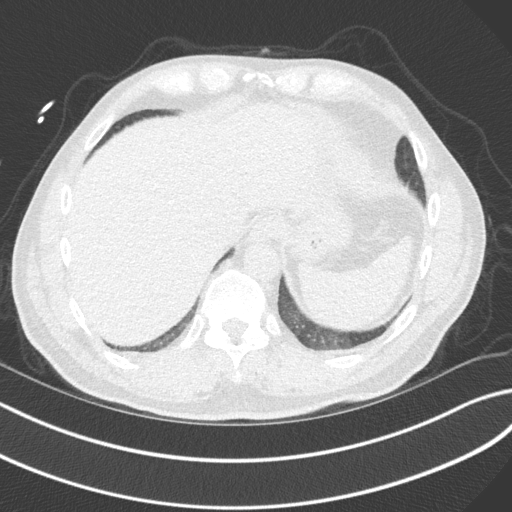
[im 15/44  lung]
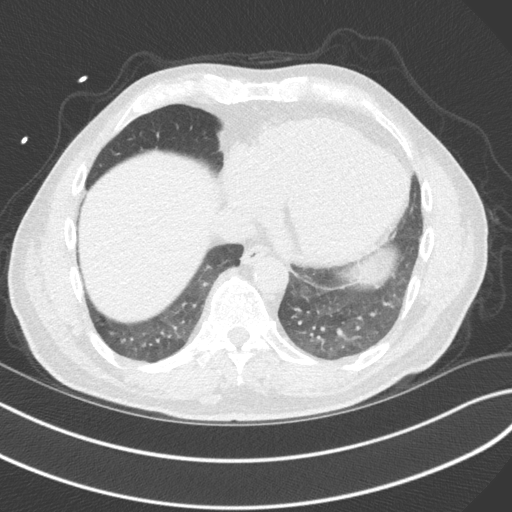
[im 22/44  lung]
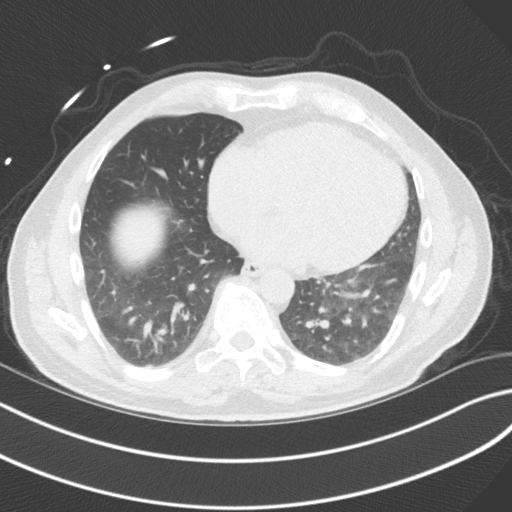
[im 29/44  lung]
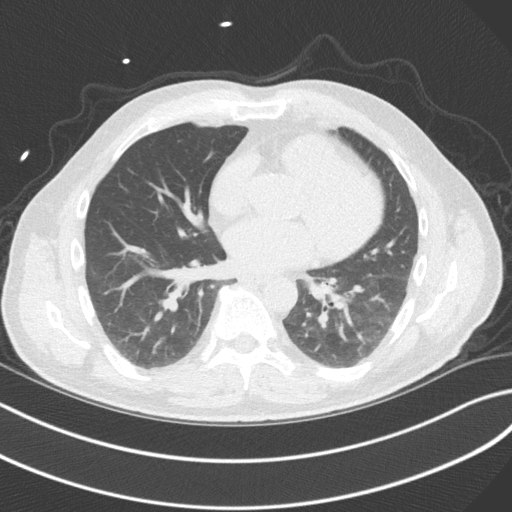
[im 36/44  lung]
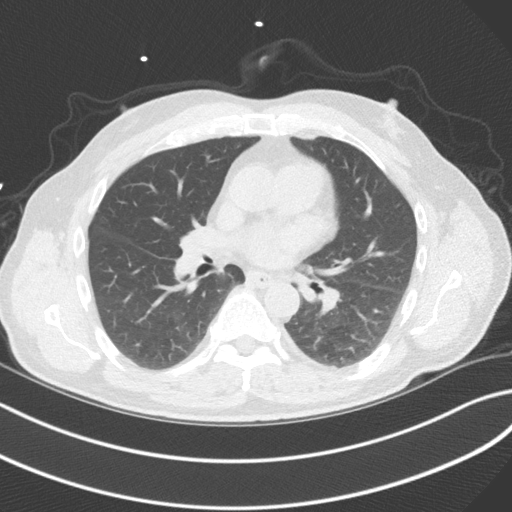

[14 of 20 positions shown; findings below may reference images not displayed]

FINDINGS: Atherosclerotic calcifications in the thoracic aorta. Small
calcified granuloma in the periphery of the right lower lobe. Within
the visualized portions of the thorax there are no other suspicious
appearing pulmonary nodules or masses, there is no acute
consolidative airspace disease, no pleural effusions, no
pneumothorax and no lymphadenopathy. Visualized portions of the
upper abdomen are unremarkable. There are no aggressive appearing
lytic or blastic lesions noted in the visualized portions of the
skeleton.
IMPRESSION: 1.  Aortic Atherosclerosis (WTIKI-1C0.0).
FINDINGS: Coronary arteries: Normal origins.

Coronary Calcium Score:

Left main: 0

Left anterior descending artery: 0

Diagonal:

Left circumflex artery: 24

Right coronary artery: 0

Total:

Percentile: 35th

Pericardium: Normal.

Ascending Aorta: Normal caliber.

Non-cardiac: See separate report from [REDACTED].
IMPRESSION: Coronary calcium score of 42.2. This was 35th percentile for age-,
race-, and sex-matched controls.



If CAC=0, it is reasonable to withhold statin therapy and reassess
in 5 to 10 years, as long as higher risk conditions are absent
(diabetes mellitus, family history of premature CHD in first degree
relatives (males <55 years; females <65 years), cigarette smoking,
or LDL >=190 mg/dL).

If CAC is 1 to 99, it is reasonable to initiate statin therapy for
patients >=55 years of age.

If CAC is >=100 or >=75th percentile, it is reasonable to initiate
statin therapy at any age.

Cardiology referral should be considered for patients with CAC
scores >=400 or >=75th percentile.

*4631 AHA/ACC/AACVPR/AAPA/ABC/KAKI/LIENAD/SANDER/Cintron/ANDO/GRATTO/SAFARI
Guideline on the Management of Blood Cholesterol: A Report of the
American College of Cardiology/American Heart Association Task Force
on Clinical Practice Guidelines. J Am Coll Cardiol.
6476;73(24):3229-3802.

*** End of Addendum ***
EXAM:
OVER-READ INTERPRETATION  CT CHEST

The following report is an over-read performed by radiologist Dr.
Sorin Oxendine [REDACTED] on 03/21/2021. This
over-read does not include interpretation of cardiac or coronary
anatomy or pathology. The coronary calcium score interpretation by
the cardiologist is attached.
FINDINGS: Atherosclerotic calcifications in the thoracic aorta. Small
calcified granuloma in the periphery of the right lower lobe. Within
the visualized portions of the thorax there are no other suspicious
appearing pulmonary nodules or masses, there is no acute
consolidative airspace disease, no pleural effusions, no
pneumothorax and no lymphadenopathy. Visualized portions of the
upper abdomen are unremarkable. There are no aggressive appearing
lytic or blastic lesions noted in the visualized portions of the
skeleton.
IMPRESSION: 1.  Aortic Atherosclerosis (WTIKI-1C0.0).

## 2023-02-10 DIAGNOSIS — Z0000 Encounter for general adult medical examination without abnormal findings: Secondary | ICD-10-CM | POA: Diagnosis not present

## 2023-02-10 DIAGNOSIS — I10 Essential (primary) hypertension: Secondary | ICD-10-CM | POA: Diagnosis not present
